# Patient Record
Sex: Female | Born: 1985 | Race: Asian | Hispanic: No | Marital: Married | Smoking: Never smoker
Health system: Southern US, Community
[De-identification: ages and names within clinical notes are randomized; demographics above are authoritative.]

---

## 2018-01-20 ENCOUNTER — Emergency Department (HOSPITAL_BASED_OUTPATIENT_CLINIC_OR_DEPARTMENT_OTHER): Payer: Self-pay

## 2018-01-20 ENCOUNTER — Other Ambulatory Visit: Payer: Self-pay

## 2018-01-20 ENCOUNTER — Emergency Department (HOSPITAL_BASED_OUTPATIENT_CLINIC_OR_DEPARTMENT_OTHER)
Admission: EM | Admit: 2018-01-20 | Discharge: 2018-01-20 | Disposition: A | Discharge status: Home / Self Care | Payer: Self-pay | Attending: Emergency Medicine | Admitting: Emergency Medicine

## 2018-01-20 ENCOUNTER — Encounter (HOSPITAL_BASED_OUTPATIENT_CLINIC_OR_DEPARTMENT_OTHER): Payer: Self-pay | Admitting: *Deleted

## 2018-01-20 DIAGNOSIS — O039 Complete or unspecified spontaneous abortion without complication: Secondary | ICD-10-CM | POA: Insufficient documentation

## 2018-01-20 DIAGNOSIS — Z3A10 10 weeks gestation of pregnancy: Secondary | ICD-10-CM | POA: Insufficient documentation

## 2018-01-20 LAB — BASIC METABOLIC PANEL
ANION GAP: 10 (ref 5–15)
BUN: 11 mg/dL (ref 6–20)
CALCIUM: 8.8 mg/dL — AB (ref 8.9–10.3)
CO2: 21 mmol/L — AB (ref 22–32)
Chloride: 102 mmol/L (ref 101–111)
Creatinine, Ser: 0.62 mg/dL (ref 0.44–1.00)
GFR calc non Af Amer: >60 mL/min (ref >60)
Glucose, Bld: 194 mg/dL — ABNORMAL HIGH (ref 65–99)
Potassium: 3.7 mmol/L (ref 3.5–5.1)
Sodium: 133 mmol/L — ABNORMAL LOW (ref 135–145)

## 2018-01-20 LAB — CBC WITH DIFFERENTIAL/PLATELET
BASOS PCT: 0 %
Basophils Absolute: 0 10*3/uL (ref 0.0–0.1)
Eosinophils Absolute: 0 10*3/uL (ref 0.0–0.7)
Eosinophils Relative: 0 %
HEMATOCRIT: 34.1 % — AB (ref 36.0–46.0)
Hemoglobin: 12 g/dL (ref 12.0–15.0)
Lymphocytes Relative: 11 %
Lymphs Abs: 1.7 10*3/uL (ref 0.7–4.0)
MCH: 30.2 pg (ref 26.0–34.0)
MCHC: 35.2 g/dL (ref 30.0–36.0)
MCV: 85.7 fL (ref 78.0–100.0)
Monocytes Absolute: 0.7 10*3/uL (ref 0.1–1.0)
Monocytes Relative: 5 %
NEUTROS ABS: 12.9 10*3/uL — AB (ref 1.7–7.7)
NEUTROS PCT: 84 %
Platelets: 262 10*3/uL (ref 150–400)
RBC: 3.98 MIL/uL (ref 3.87–5.11)
RDW: 12.3 % (ref 11.5–15.5)
WBC: 15.3 10*3/uL — ABNORMAL HIGH (ref 4.0–10.5)

## 2018-01-20 LAB — ABO/RH: ABO/RH(D): B POS

## 2018-01-20 LAB — HCG, QUANTITATIVE, PREGNANCY: hCG, Beta Chain, Quant, S: 64426 m[IU]/mL — ABNORMAL HIGH (ref <5)

## 2018-01-20 MED ORDER — MORPHINE SULFATE (PF) 4 MG/ML IV SOLN
4.0000 mg | Freq: Once | INTRAVENOUS | Status: AC
  Administered 2018-01-20: 4 mg via INTRAVENOUS
  Filled 2018-01-20: qty 1

## 2018-01-20 MED ORDER — KETOROLAC TROMETHAMINE 30 MG/ML IJ SOLN
30.0000 mg | Freq: Once | INTRAMUSCULAR | Status: AC
  Administered 2018-01-20: 30 mg via INTRAVENOUS
  Filled 2018-01-20: qty 1

## 2018-01-20 MED ORDER — IBUPROFEN 600 MG PO TABS: 600.0000 mg | ORAL_TABLET | Freq: Four times a day (QID) | ORAL | 0 refills | Status: AC | PRN

## 2018-01-20 MED ORDER — ONDANSETRON HCL 4 MG/2ML IJ SOLN
4.0000 mg | Freq: Once | INTRAMUSCULAR | Status: AC
  Administered 2018-01-20: 4 mg via INTRAVENOUS
  Filled 2018-01-20: qty 2

## 2018-01-20 MED ORDER — HYDROCODONE-ACETAMINOPHEN 5-325 MG PO TABS: 1.0000 | ORAL_TABLET | ORAL | 0 refills | Status: AC | PRN

## 2018-01-20 MED ORDER — SODIUM CHLORIDE 0.9 % IV BOLUS
1000.0000 mL | Freq: Once | INTRAVENOUS | Status: AC
  Administered 2018-01-20: 1000 mL via INTRAVENOUS

## 2018-01-20 NOTE — Discharge Instructions (Signed)
You will need to follow your HCG level (pregnancy level) until it reaches zero.  Your Samantha Ibarra will do this.

## 2018-01-20 NOTE — ED Notes (Addendum)
Pt on cardiac monitor and auto VS 

## 2018-01-20 NOTE — ED Provider Notes (Signed)
[facility] EMERGENCY DEPARTMENT Provider Note   CSN: [id number] Arrival date & time: 01/20/18  1533     History   Chief Complaint Chief Complaint  Patient presents with  . Vaginal Bleeding    HPI Samantha Ibarra is a 32 y.o. female.  Pt presents to the ED today with vaginal bleeding and abdominal pain.  The pt is [redacted] weeks pregnant.  She is a patient of [employer].  She had some spotting at about 9 weeks and was seen at the office.  She had an [place] then which showed an intrauterine pregnancy with FHTs.  The pt started having heavy vaginal bleeding with cramps and passing blood clots this morning.       History reviewed. No pertinent past medical history.  There are no active problems to display for this patient.   Past Surgical History:  Procedure Laterality Date  . CESAREAN SECTION       OB History    Gravida  1   Para      Term      Preterm      AB      Living        SAB      TAB      Ectopic      Multiple      Live Births               Home Medications    Prior to Admission medications   Medication Sig Start Date End Date Taking? Authorizing Provider  Prenatal Vit-Fe Fumarate-FA (PRENATAL MULTIVITAMIN) TABS tablet Take 1 tablet by mouth daily at 12 noon.   Yes [provider]  HYDROcodone-acetaminophen (NORCO/VICODIN) 5-325 MG tablet Take 1 tablet by mouth every 4 (four) hours as needed. 01/20/18   Jacalyn Lefevre, MD  ibuprofen (ADVIL,MOTRIN) 600 MG tablet Take 1 tablet (600 mg total) by mouth every 6 (six) hours as needed. 01/20/18   Jacalyn Lefevre, MD    Family History History reviewed. No pertinent family history.  Social History Social History   Tobacco Use  . Smoking status: Never Smoker  Substance Use Topics  . Alcohol use: Not Currently  . Drug use: Not Currently     Allergies   Patient has no known allergies.   Review of Systems Review of Systems  Gastrointestinal: Positive for abdominal pain.    Genitourinary: Positive for pelvic pain and vaginal bleeding.  All other systems reviewed and are negative.    Physical Exam Updated Vital Signs BP (!) 90/56 (BP Location: Right Arm)   Pulse 78   Temp 98.2 F (36.8 C) (Oral)   Resp 12   Ht  (1.626 m)   Wt 60.3 kg (133 lb)   SpO2 100%   BMI 22.83 kg/m   Physical Exam  Constitutional: She is oriented to person, place, and time. She appears well-developed and well-nourished.  HENT:  Head: Normocephalic and atraumatic.  Right Ear: External ear normal.  Left Ear: External ear normal.  Nose: Nose normal.  Mouth/Throat: Oropharynx is clear and moist.  Eyes: Pupils are equal, round, and reactive to light. Conjunctivae and EOM are normal.  Neck: Normal range of motion. Neck supple.  Cardiovascular: Normal rate, regular rhythm, normal heart sounds and intact distal pulses.  Pulmonary/Chest: Effort normal and breath sounds normal.  Abdominal: Soft. There is tenderness in the suprapubic area.  Genitourinary: Right adnexum displays no tenderness. Left adnexum displays no tenderness. There is bleeding in the vagina.  Genitourinary Comments: Cervix is open. Multiple blood clots evacuated with ringed forceps.  Musculoskeletal: Normal range of motion.  Neurological: She is alert and oriented to person, place, and time.  Skin: Skin is warm. Capillary refill takes less than 2 seconds.  Psychiatric: She has a normal mood and affect. Her behavior is normal. Judgment and thought content normal.  Nursing note and vitals reviewed.    ED Treatments / Results  Labs (all labs ordered are listed, but only abnormal results are displayed) Labs Reviewed  BASIC METABOLIC PANEL - Abnormal; Notable for the following components:      Result Value   Sodium 133 (*)    CO2 21 (*)    Glucose, Bld 194 (*)    Calcium 8.8 (*)    All other components within normal limits  CBC WITH DIFFERENTIAL/PLATELET - Abnormal; Notable for the following  components:   WBC 15.3 (*)    HCT 34.1 (*)    Neutro Abs 12.9 (*)    All other components within normal limits  HCG, QUANTITATIVE, PREGNANCY - Abnormal; Notable for the following components:   hCG, Beta Chain, Quant, S [id number] (*)    All other components within normal limits  URINALYSIS, ROUTINE W REFLEX MICROSCOPIC  ABO/RH    EKG None  Radiology US Ob Comp < 14 Wks  Result Date: 01/20/2018 CLINICAL DATA:  Vaginal bleeding and cramping. Reported outside prior ultrasound demonstrated intrauterine gestation EXAM: OBSTETRIC <14 WK [place] AND TRANSVAGINAL OB US TECHNIQUE: Both transabdominal and transvaginal ultrasound examinations were performed for complete evaluation of the gestation as well as the maternal uterus, adnexal regions, and pelvic cul-de-sac. Transvaginal technique was performed to assess early pregnancy. COMPARISON:  None. FINDINGS: Intrauterine gestational sac: Not visualized Yolk sac:  Not visualized Embryo:  Not visualized Cardiac Activity: Not visualized Subchorionic hemorrhage:  None visualized. Maternal uterus/adnexae: Endometrium appears irregular in contour and inhomogeneous in echotexture. There is echogenic material in the lower uterine segment of the endometrium. Right ovary measures 2.4 x 1.8 x 1.8 cm. Left ovary measures 3.4 x 2.1 x 2.3 cm. There is a corpus luteum in the left ovary measuring 1.7 x 1.6 x 1.6 cm. There is no free pelvic fluid. IMPRESSION: There is no intrauterine gestation present. The endometrium appears irregular in contour and inhomogeneous with echogenic material in the lower uterine segment. Given the clinical history of recent intrauterine gestation documented by ultrasound, the current findings are indicative of recent spontaneous abortion with hemorrhage within the endometrium. Retained products of conception in the lower uterus segment cannot be entirely excluded, although the appearance in the lower uterine segment may be due entirely to hemorrhage from  recent spontaneous abortion. Beta HCG value should be followed to 0 to exclude the unlikely possibility of ectopic gestation in this circumstance. No findings suggesting ectopic gestation or seen on this study. Electronically Signed   By: Bretta Bang III M.D.   On: 01/20/2018 18:18   US Ob Transvaginal  Result Date: 01/20/2018 CLINICAL DATA:  Vaginal bleeding and cramping. Reported outside prior ultrasound demonstrated intrauterine gestation EXAM: OBSTETRIC <14 WK [place] AND TRANSVAGINAL OB US TECHNIQUE: Both transabdominal and transvaginal ultrasound examinations were performed for complete evaluation of the gestation as well as the maternal uterus, adnexal regions, and pelvic cul-de-sac. Transvaginal technique was performed to assess early pregnancy. COMPARISON:  None. FINDINGS: Intrauterine gestational sac: Not visualized Yolk sac:  Not visualized Embryo:  Not visualized Cardiac Activity: Not visualized Subchorionic hemorrhage:  None visualized. Maternal uterus/adnexae: Endometrium appears  irregular in contour and inhomogeneous in echotexture. There is echogenic material in the lower uterine segment of the endometrium. Right ovary measures 2.4 x 1.8 x 1.8 cm. Left ovary measures 3.4 x 2.1 x 2.3 cm. There is a corpus luteum in the left ovary measuring 1.7 x 1.6 x 1.6 cm. There is no free pelvic fluid. IMPRESSION: There is no intrauterine gestation present. The endometrium appears irregular in contour and inhomogeneous with echogenic material in the lower uterine segment. Given the clinical history of recent intrauterine gestation documented by ultrasound, the current findings are indicative of recent spontaneous abortion with hemorrhage within the endometrium. Retained products of conception in the lower uterus segment cannot be entirely excluded, although the appearance in the lower uterine segment may be due entirely to hemorrhage from recent spontaneous abortion. Beta HCG value should be followed to 0 to  exclude the unlikely possibility of ectopic gestation in this circumstance. No findings suggesting ectopic gestation or seen on this study. Electronically Signed   By: Bretta Bang III M.D.   On: 01/20/2018 18:18    Procedures Procedures (including critical care time)  Medications Ordered in ED Medications  sodium chloride 0.9 % bolus 1,000 mL (0 mLs Intravenous Stopped 01/20/18 1813)  ondansetron (ZOFRAN) injection 4 mg (4 mg Intravenous Given 01/20/18 1722)  morphine 4 MG/ML injection 4 mg (4 mg Intravenous Given 01/20/18 1724)  ketorolac (TORADOL) 30 MG/ML injection 30 mg (30 mg Intravenous Given 01/20/18 1902)     Initial Impression / Assessment and Plan / ED Course  I have reviewed the triage vital signs and the nursing notes.  Pertinent labs & imaging results that were available during my care of the patient were reviewed by me and considered in my medical decision making (see chart for details).   Pt is feeling much better after blood clots have been removed.  Bleeding has slowed down.  Pt is to f/u with her obgyn.  She knows to return if bleeding or pain worsens.    Final Clinical Impressions(s) / ED Diagnoses   Final diagnoses:  Spontaneous abortion    ED Discharge Orders        Ordered    ibuprofen (ADVIL,MOTRIN) 600 MG tablet  Every 6 hours PRN     01/20/18 1912    HYDROcodone-acetaminophen (NORCO/VICODIN) 5-325 MG tablet  Every 4 hours PRN     01/20/18 1912       Jacalyn Lefevre, MD 01/20/18 1914

## 2018-01-20 NOTE — ED Notes (Signed)
Patient transported to Ultrasound 

## 2018-01-20 NOTE — ED Triage Notes (Signed)
Pt reports that she has vaginal bleeding that started this morning. States that she has had to change her pad x 2 since 9am. Pt just left [facility] AMA due to wait times.  Pt is pregnant EDD: ZOX09, 2019.  Pt has had an ultrasound, and that 'baby was in the right place'. Pt reports lower abdominal cramping. No acute distress noted. OBGYN: [facility] in [facility].

## 2018-01-20 NOTE — ED Notes (Signed)
ED Provider at bedside with EMT for pelvic exam

## 2019-10-19 IMAGING — US US OB TRANSVAGINAL
1 series · 13 of 28 positions shown · non-contrast
Comparison: None.

CLINICAL DATA: Vaginal bleeding and cramping. Reported outside
prior ultrasound demonstrated intrauterine gestation

EXAM:
OBSTETRIC <14 WK US AND TRANSVAGINAL OB US
TECHNIQUE: Both transabdominal and transvaginal ultrasound examinations were
performed for complete evaluation of the gestation as well as the
maternal uterus, adnexal regions, and pelvic cul-de-sac.
Transvaginal technique was performed to assess early pregnancy.

[Series 1: us ob transvaginal · 0.20mm/px · 13 of 47 slices shown]
[im 2/47]
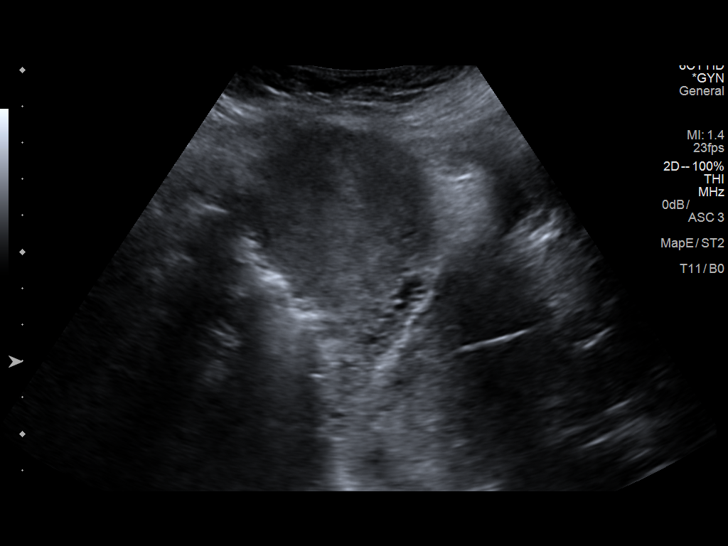
[im 6/47]
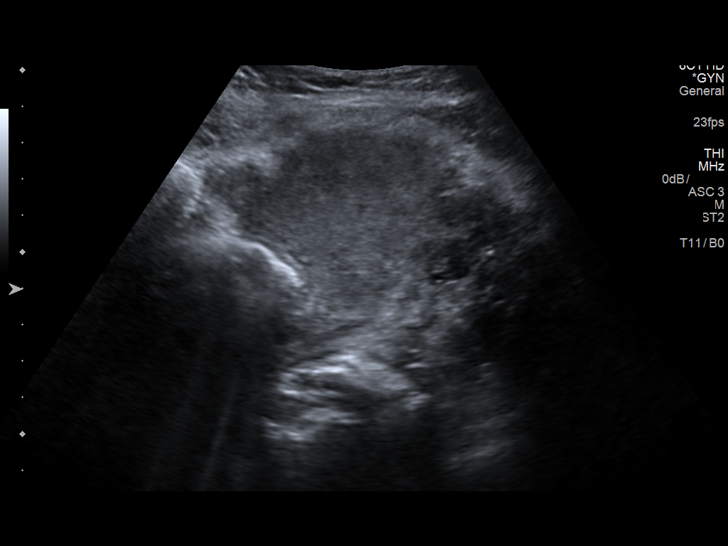
[im 9/47]
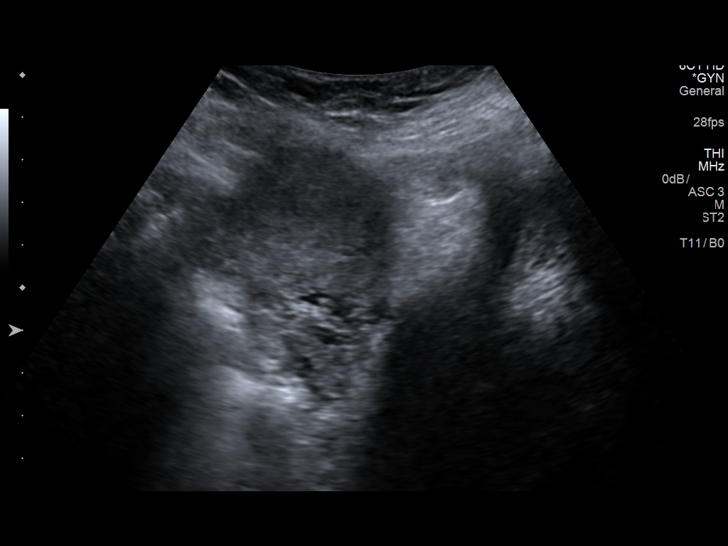
[im 12/47]
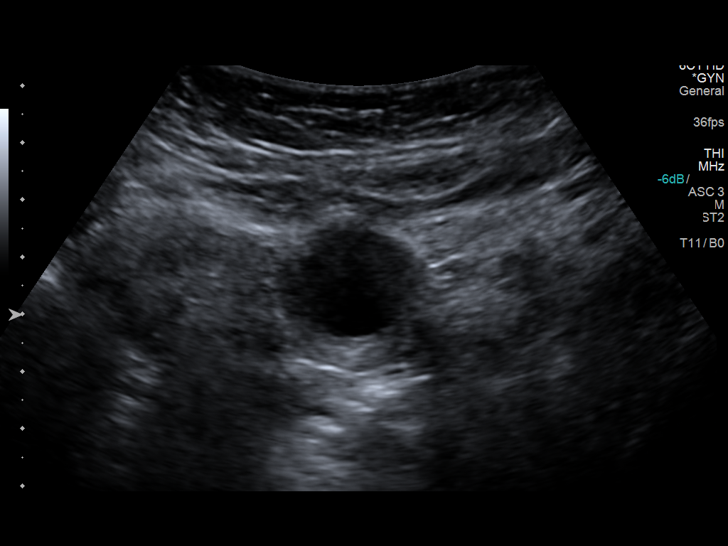
[im 16/47]
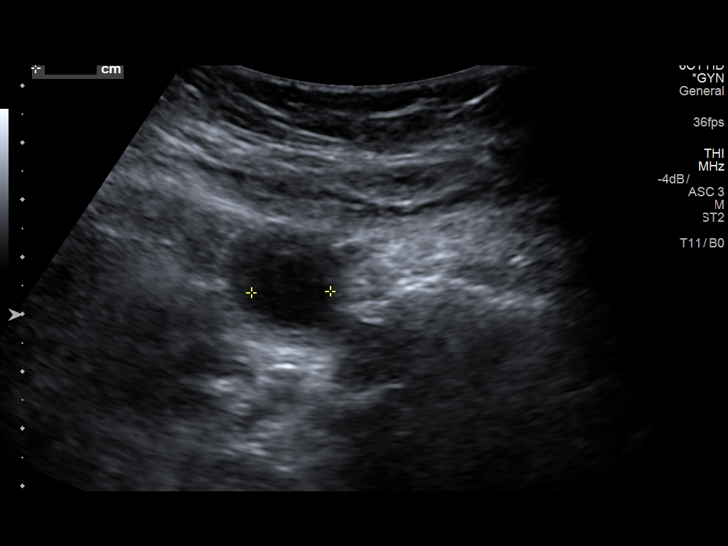
[im 19/47]
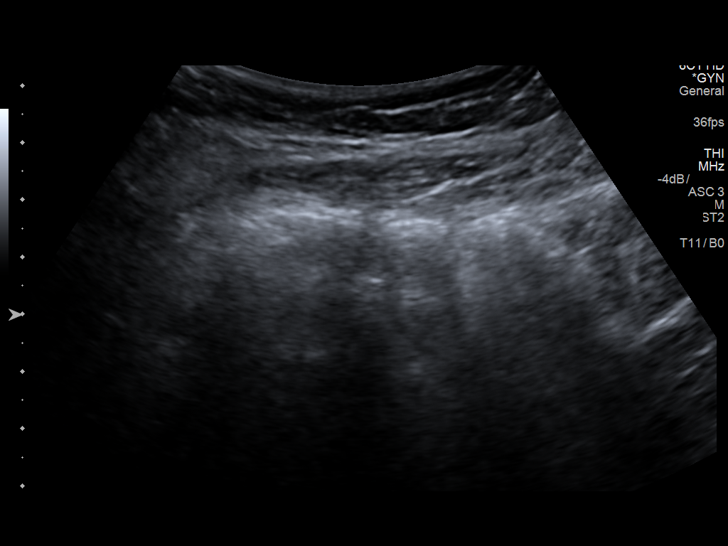
[im 24/47]
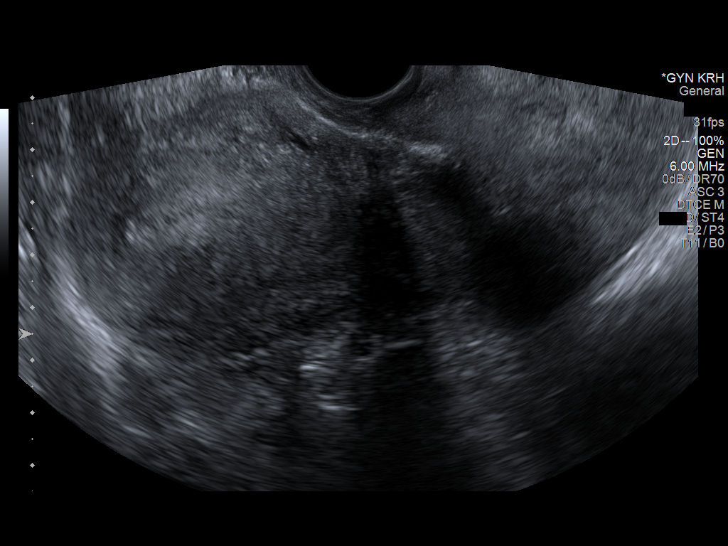
[im 28/47]
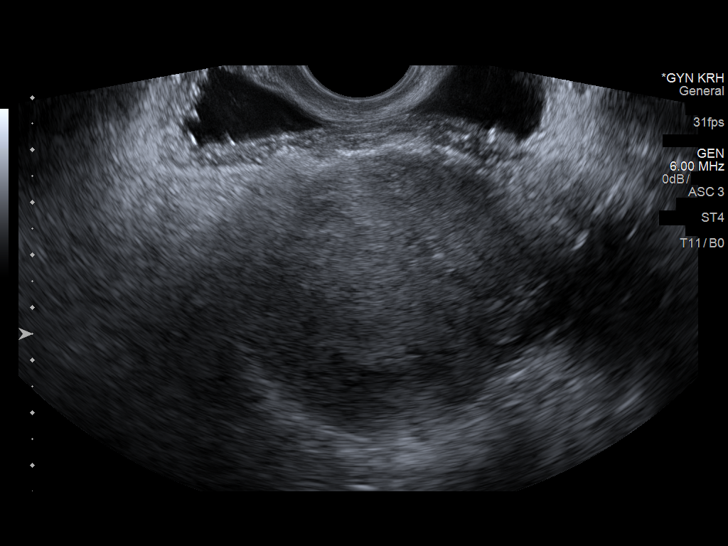
[im 31/47]
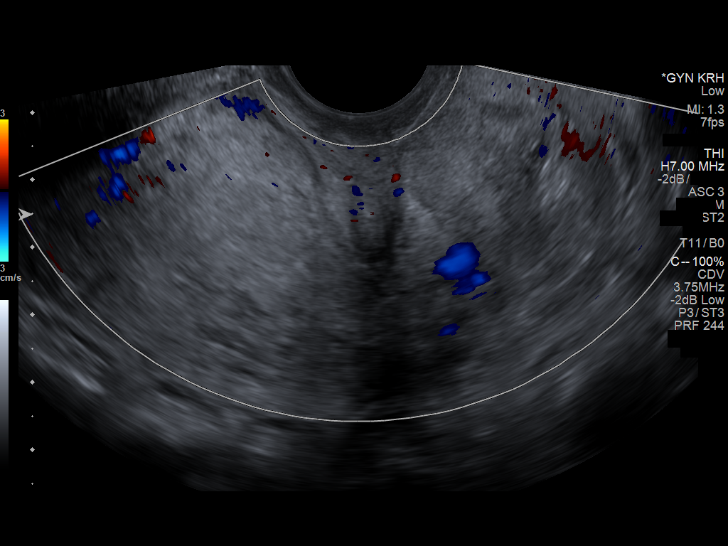
[im 35/47]
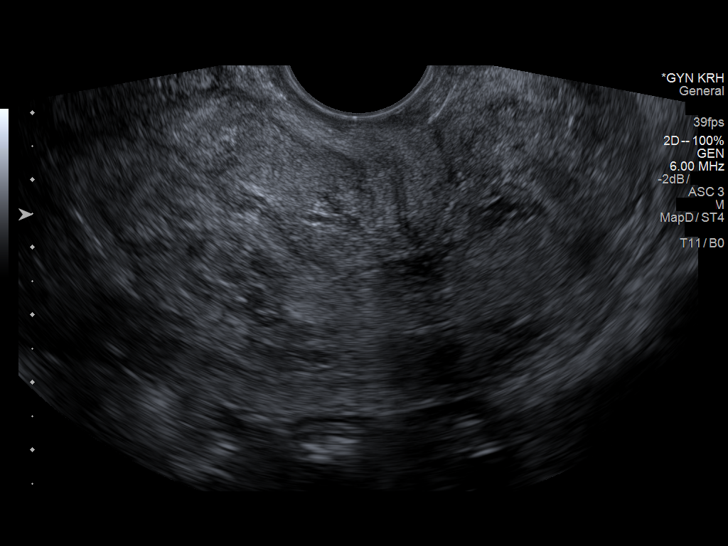
[im 38/47]
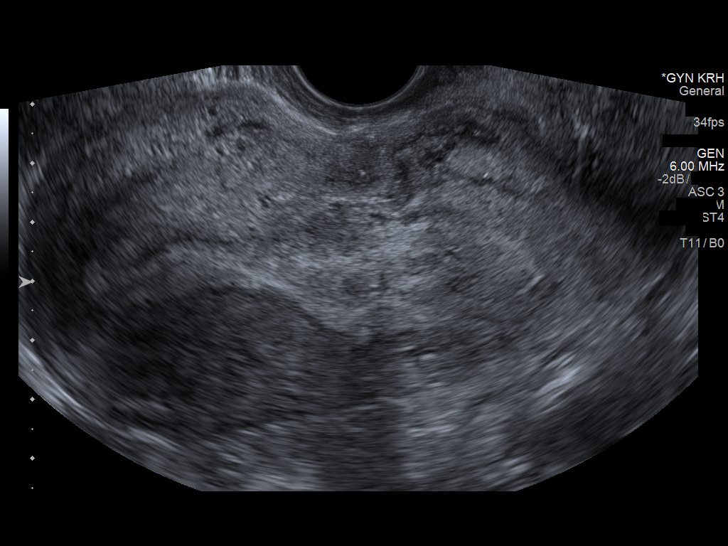
[im 41/47]
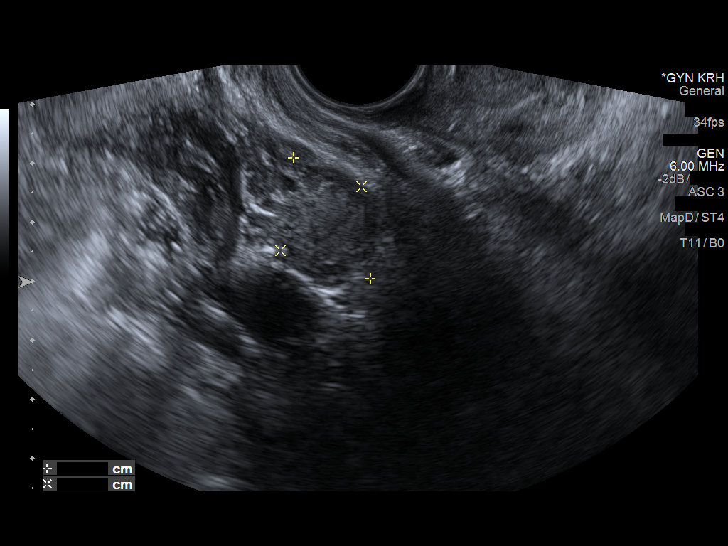
[im 45/47]
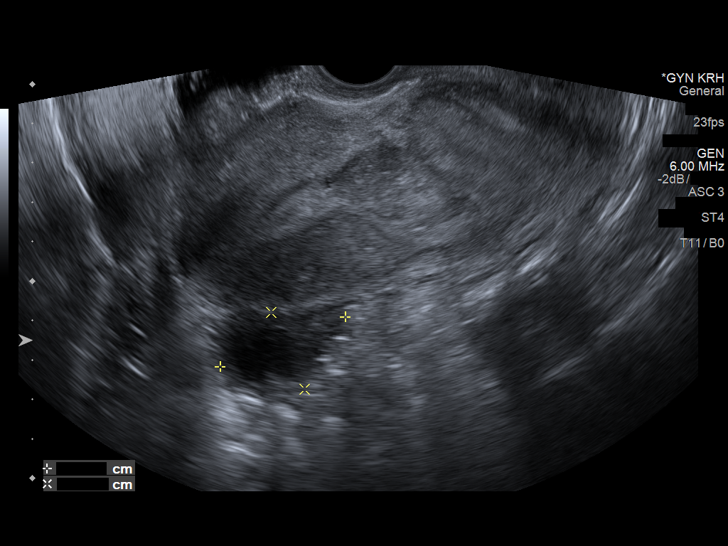

[13 of 28 positions shown; findings below may reference images not displayed]

FINDINGS: Intrauterine gestational sac: Not visualized

Yolk sac:  Not visualized

Embryo:  Not visualized

Cardiac Activity: Not visualized

Subchorionic hemorrhage:  None visualized.

Maternal uterus/adnexae: Endometrium appears irregular in contour
and inhomogeneous in echotexture. There is echogenic material in the
lower uterine segment of the endometrium.

Right ovary measures 2.4 x 1.8 x 1.8 cm. Left ovary measures 3.4 x
2.1 x 2.3 cm. There is a corpus luteum in the left ovary measuring
1.7 x 1.6 x 1.6 cm. There is no free pelvic fluid.
IMPRESSION: There is no intrauterine gestation present. The endometrium appears
irregular in contour and inhomogeneous with echogenic material in
the lower uterine segment. Given the clinical history of recent
intrauterine gestation documented by ultrasound, the current
findings are indicative of recent spontaneous abortion with
hemorrhage within the endometrium. Retained products of conception
in the lower uterus segment cannot be entirely excluded, although
the appearance in the lower uterine segment may be due entirely to
hemorrhage from recent spontaneous abortion.

Beta HCG value should be followed to 0 to exclude the unlikely
possibility of ectopic gestation in this circumstance. No findings
suggesting ectopic gestation or seen on this study.
